# Patient Record
Sex: Female | Born: 1970 | Race: White | Hispanic: No | Smoking: Current every day smoker
Health system: Southern US, Community
[De-identification: ages and names within clinical notes are randomized; demographics above are authoritative.]

## PROBLEM LIST (undated history)

## (undated) DIAGNOSIS — E119 Type 2 diabetes mellitus without complications: Secondary | ICD-10-CM

## (undated) DIAGNOSIS — I82409 Acute embolism and thrombosis of unspecified deep veins of unspecified lower extremity: Secondary | ICD-10-CM

## (undated) DIAGNOSIS — M549 Dorsalgia, unspecified: Secondary | ICD-10-CM

## (undated) HISTORY — PX: VENTRICULOPERITONEAL SHUNT: SHX204

## (undated) HISTORY — PX: CHOLECYSTECTOMY: SHX55

---

## 1999-03-08 ENCOUNTER — Emergency Department (HOSPITAL_COMMUNITY): Admission: EM | Admit: 1999-03-08 | Discharge: 1999-03-08 | Payer: Self-pay | Admitting: Emergency Medicine

## 1999-03-17 ENCOUNTER — Emergency Department (HOSPITAL_COMMUNITY): Admission: EM | Admit: 1999-03-17 | Discharge: 1999-03-17 | Payer: Self-pay | Admitting: Emergency Medicine

## 1999-12-02 ENCOUNTER — Encounter: Admission: RE | Admit: 1999-12-02 | Discharge: 2000-01-02 | Payer: Self-pay | Admitting: Neurosurgery

## 2000-11-25 ENCOUNTER — Emergency Department (HOSPITAL_COMMUNITY): Admission: EM | Admit: 2000-11-25 | Discharge: 2000-11-25 | Payer: Self-pay | Admitting: Emergency Medicine

## 2000-11-25 ENCOUNTER — Encounter: Payer: Self-pay | Admitting: Emergency Medicine

## 2004-12-15 ENCOUNTER — Encounter: Admission: RE | Admit: 2004-12-15 | Discharge: 2004-12-15 | Payer: Self-pay | Admitting: *Deleted

## 2005-01-27 ENCOUNTER — Encounter (INDEPENDENT_AMBULATORY_CARE_PROVIDER_SITE_OTHER): Payer: Self-pay | Admitting: Specialist

## 2005-01-27 ENCOUNTER — Ambulatory Visit (HOSPITAL_COMMUNITY): Admission: RE | Admit: 2005-01-27 | Discharge: 2005-01-27 | Payer: Self-pay | Admitting: General Surgery

## 2007-06-28 ENCOUNTER — Other Ambulatory Visit: Admission: RE | Admit: 2007-06-28 | Discharge: 2007-06-28 | Payer: Self-pay | Admitting: Obstetrics and Gynecology

## 2008-03-23 ENCOUNTER — Encounter: Admission: RE | Admit: 2008-03-23 | Discharge: 2008-03-23 | Payer: Self-pay | Admitting: Neurology

## 2008-04-21 ENCOUNTER — Encounter: Admission: RE | Admit: 2008-04-21 | Discharge: 2008-04-21 | Payer: Self-pay | Admitting: Family Medicine

## 2008-04-24 ENCOUNTER — Ambulatory Visit: Payer: Self-pay | Admitting: Oncology

## 2008-07-17 ENCOUNTER — Encounter: Admission: RE | Admit: 2008-07-17 | Discharge: 2008-07-17 | Payer: Self-pay | Admitting: Gastroenterology

## 2008-07-20 ENCOUNTER — Encounter: Admission: RE | Admit: 2008-07-20 | Discharge: 2008-07-20 | Payer: Self-pay | Admitting: Gastroenterology

## 2008-07-31 ENCOUNTER — Other Ambulatory Visit: Admission: RE | Admit: 2008-07-31 | Discharge: 2008-07-31 | Payer: Self-pay | Admitting: Obstetrics and Gynecology

## 2008-08-03 ENCOUNTER — Ambulatory Visit: Payer: Self-pay | Admitting: Oncology

## 2008-08-31 LAB — COMPREHENSIVE METABOLIC PANEL
ALT: 23 U/L (ref 0–35)
AST: 20 U/L (ref 0–37)
BUN: 12 mg/dL (ref 6–23)
CO2: 24 mEq/L (ref 19–32)
Calcium: 9 mg/dL (ref 8.4–10.5)
Chloride: 110 mEq/L (ref 96–112)
Creatinine, Ser: 0.73 mg/dL (ref 0.40–1.20)
Total Bilirubin: 0.6 mg/dL (ref 0.3–1.2)

## 2008-08-31 LAB — CBC WITH DIFFERENTIAL/PLATELET
BASO%: 0.2 % (ref 0.0–2.0)
Basophils Absolute: 0 10*3/uL (ref 0.0–0.1)
EOS%: 1.2 % (ref 0.0–7.0)
HCT: 37.5 % (ref 34.8–46.6)
HGB: 12.5 g/dL (ref 11.6–15.9)
LYMPH%: 25.5 % (ref 14.0–49.7)
MCH: 29.6 pg (ref 25.1–34.0)
MCHC: 33.3 g/dL (ref 31.5–36.0)
NEUT%: 67 % (ref 38.4–76.8)
Platelets: 309 10*3/uL (ref 145–400)
lymph#: 2.5 10*3/uL (ref 0.9–3.3)

## 2008-08-31 LAB — LACTATE DEHYDROGENASE: LDH: 140 U/L (ref 94–250)

## 2008-09-05 LAB — D-DIMER, QUANTITATIVE: D-Dimer, Quant: 0.22 ug/mL-FEU (ref 0.00–0.48)

## 2008-09-05 LAB — FACTOR 8 ASSAY: Coagulation Factor VIII: 126 % (ref 73–140)

## 2008-09-08 LAB — HYPERCOAGULABLE PANEL, COMPREHENSIVE RET.

## 2008-10-07 ENCOUNTER — Ambulatory Visit: Payer: Self-pay | Admitting: Oncology

## 2008-11-26 ENCOUNTER — Ambulatory Visit: Payer: Self-pay | Admitting: Oncology

## 2008-12-30 ENCOUNTER — Ambulatory Visit: Payer: Self-pay | Admitting: Oncology

## 2009-03-24 ENCOUNTER — Ambulatory Visit: Payer: Self-pay | Admitting: Oncology

## 2009-04-15 ENCOUNTER — Ambulatory Visit: Payer: Self-pay | Admitting: Vascular Surgery

## 2009-04-15 ENCOUNTER — Encounter (HOSPITAL_COMMUNITY): Payer: Self-pay | Admitting: Oncology

## 2009-04-15 ENCOUNTER — Ambulatory Visit: Admission: RE | Admit: 2009-04-15 | Discharge: 2009-04-15 | Payer: Self-pay | Admitting: Oncology

## 2009-05-11 ENCOUNTER — Ambulatory Visit: Payer: Self-pay | Admitting: Oncology

## 2009-07-29 ENCOUNTER — Ambulatory Visit: Payer: Self-pay | Admitting: Oncology

## 2009-08-20 LAB — PROTEIN C, TOTAL: Protein C, Total: 117 % (ref 70–140)

## 2009-08-20 LAB — PROTEIN S ACTIVITY: Protein S Activity: 113 % (ref 69–129)

## 2009-08-20 LAB — PROTEIN C ACTIVITY: Protein C Activity: 162 % — ABNORMAL HIGH (ref 75–133)

## 2009-08-20 LAB — PROTEIN S, TOTAL: Protein S Ag, Total: 133 % (ref 70–140)

## 2009-08-20 LAB — PROTEIN S, ANTIGEN, FREE: Protein S Ag, Free: 114 % normal (ref 50–147)

## 2009-09-09 ENCOUNTER — Ambulatory Visit: Payer: Self-pay | Admitting: Oncology

## 2010-07-05 ENCOUNTER — Other Ambulatory Visit: Payer: Self-pay | Admitting: Family Medicine

## 2010-07-05 ENCOUNTER — Inpatient Hospital Stay: Admission: RE | Admit: 2010-07-05 | Payer: Self-pay | Source: Ambulatory Visit

## 2010-07-05 DIAGNOSIS — M79606 Pain in leg, unspecified: Secondary | ICD-10-CM

## 2010-07-05 DIAGNOSIS — R609 Edema, unspecified: Secondary | ICD-10-CM

## 2010-08-12 NOTE — Op Note (Signed)
Allison Vasquez, Allison Vasquez              ACCOUNT NO.:  [id number]   MEDICAL RECORD NO.:  [id number]          PATIENT TYPE:  OIB   LOCATION:  5731                         FACILITY:  [facility]   PHYSICIAN:  Ollen Gross. Vernell Morgans, M.D. DATE OF BIRTH:  1971-01-20   DATE OF PROCEDURE:  01/31/2005  DATE OF DISCHARGE:  01/27/2005                                 OPERATIVE REPORT   PREOPERATIVE DIAGNOSIS:  Gallstones.   POSTOP DIAGNOSIS:  Gallstones.   PROCEDURE:  Laparoscopic cholecystectomy with intraoperative cholangiogram.   SURGEON:  Dr. Carolynne Edouard.   ASSISTANT:  Dr. Derrell Lolling.   ANESTHESIA:  General endotracheal.   PROCEDURE:  After informed consent was obtained, the patient was brought to  the operating and placed in supine position on the operating table. After  adequate induction of general anesthesia, the patient's abdomen was prepped  with Betadine and draped in usual sterile manner. The area below the  umbilicus was infiltrated with 4% Marcaine. A small incision was made with a  15 blade knife. This incision was carried down through the skin through the  subcutaneous tissue bluntly with a hemostat and [employer] retractors until  the linea alba was identified. The linea alba was incised with a 7 blade  knife and each side was grasped Kocher clamps, elevated anteriorly. The  preperitoneal space was then probed bluntly with a hemostat until the  peritoneum was opened.  Access was gained to the abdominal cavity in the  fascia surrounding the opening.  A Hasson cannula was placed through the  opening and anchored in place with a previously placed Vicryl pursestring  stitch.  The abdomen was then insufflated with carbon dioxide without  difficulty. A laparoscope was inserted through the Hasson cannula and the  right upper quadrant was inspected. The dome of gallbladder and liver  readily identified. The patient had a VP shunt in place and VP shunt  appeared to be in good position and we were able to  easily avoid the shunt.  Next the epigastric region was infiltrated with 4% Marcaine. A small  incision was made with a 15 blade knife and 10 mm port was placed bluntly  through this incision into the abdominal cavity under direct vision. Sites  were then chosen lateral on the right side of the abdomen for placement of 5  mm ports. Each of these areas was infiltrated with 0.25% marcaine. 5 mm  ports were placed  through these incisions into the abdominal cavity under  direct vision. A blunt grasper was placed through the lateral most 5 mm port  and used to grasp the dome of gallbladder and elevate it anteriorly and  superiorly. Another blunt grasper was placed through the other 5 mm port and  used to retract on the body and neck of the gallbladder.  A dissector was  placed through the epigastric port and using electrocautery the peritoneal  reflection was opened. Blunt dissection was then carried out in this area  until the gallbladder neck cystic duct junction was readily identified and a  good window was created. The single clip was placed on the gallbladder  neck.  A small ductotomy was made just below the clip with the  laparoscopic  scissors. A 14 gauge Angiocath was then placed percutaneously through the  anterior abdominal wall under direct vision. A Reddick cholangiogram  catheter was then placed through the Angiocath and flushed. The Reddick  catheter was then placed within the cystic duct and anchored in place with  the clip and good length on the cystic duct.  The anchoring clip and  catheter were then removed from the patient. The three clips were placed  proximally on the duct and duct was divided between the two sets of clips.  Posterior to this, the cystic artery was identified and again dissected  bluntly in a circumferential manner until a good window was created. Two  clips placed proximal and one distal on the artery and the artery was  divided between the two. Next a  laparoscopic hook cautery device was used to  separate the gallbladder from the liver bed.  Prior to completely detaching  the gallbladder from the liver bed, the liver bed was inspected and several  small bleeding points were coagulated with electrocautery.  A small hole was  made in the gallbladder during this dissection and the bile was aspirated  out. Next the gallbladder was detached the rest of the way using the hook  electrocautery. A laparoscopic bag was inserted through the epigastric port.  The gallbladder was placed within the bag and bag was sealed.  The abdomen  was then irrigated with copious amounts of saline until the effluent was  clear. The laparoscope was then moved to the epigastric port and gallbladder  grasper was placed through the Hasson cannula and used to grasp the opening  of the bag. The bag with the gallbladder then removed through the  infraumbilical port without difficulty. The fascial defect was closed with  the previously placed Vicryl pursestring stitch as well as with another  interrupted 0 Vicryl stitch. The rest of ports were removed under direct  vision. Gas was allowed to escape. All ports were hemostatic. The skin  incisions were then closed with interrupted 4-0 Monocryl subcu stitches.  Benzoin, Steri-Strips were applied. The patient tolerated the procedure  well.  At the end of the case, all needle, sponge and instrument counts were  correct. The patient was then awakened and taken recovery in stable  condition.      Ollen Gross. Vernell Morgans, M.D.  Electronically Signed     PST/MEDQ  D:  01/31/2005  T:  02/01/2005  Job:  [id number]

## 2011-05-12 ENCOUNTER — Other Ambulatory Visit (HOSPITAL_COMMUNITY)
Admission: RE | Admit: 2011-05-12 | Discharge: 2011-05-12 | Disposition: A | Discharge status: Home / Self Care | Payer: PRIVATE HEALTH INSURANCE | Source: Ambulatory Visit | Attending: Obstetrics and Gynecology | Admitting: Obstetrics and Gynecology

## 2011-05-12 ENCOUNTER — Other Ambulatory Visit: Payer: Self-pay | Admitting: Obstetrics and Gynecology

## 2011-05-12 DIAGNOSIS — Z01419 Encounter for gynecological examination (general) (routine) without abnormal findings: Secondary | ICD-10-CM | POA: Insufficient documentation

## 2011-06-29 ENCOUNTER — Ambulatory Visit
Admission: RE | Admit: 2011-06-29 | Discharge: 2011-06-29 | Disposition: A | Discharge status: Home / Self Care | Payer: PRIVATE HEALTH INSURANCE | Source: Ambulatory Visit | Attending: Family Medicine | Admitting: Family Medicine

## 2011-06-29 ENCOUNTER — Other Ambulatory Visit: Payer: Self-pay | Admitting: Family Medicine

## 2011-06-29 DIAGNOSIS — R519 Headache, unspecified: Secondary | ICD-10-CM

## 2011-12-26 ENCOUNTER — Other Ambulatory Visit: Payer: Self-pay | Admitting: Obstetrics and Gynecology

## 2011-12-26 DIAGNOSIS — Z1231 Encounter for screening mammogram for malignant neoplasm of breast: Secondary | ICD-10-CM

## 2012-01-16 ENCOUNTER — Ambulatory Visit: Payer: PRIVATE HEALTH INSURANCE

## 2012-02-26 ENCOUNTER — Ambulatory Visit
Admission: RE | Admit: 2012-02-26 | Discharge: 2012-02-26 | Disposition: A | Discharge status: Home / Self Care | Payer: BC Managed Care – PPO | Source: Ambulatory Visit | Attending: Obstetrics and Gynecology | Admitting: Obstetrics and Gynecology

## 2012-02-26 DIAGNOSIS — Z1231 Encounter for screening mammogram for malignant neoplasm of breast: Secondary | ICD-10-CM

## 2012-10-23 ENCOUNTER — Ambulatory Visit
Admission: RE | Admit: 2012-10-23 | Discharge: 2012-10-23 | Disposition: A | Discharge status: Home / Self Care | Payer: BC Managed Care – PPO | Source: Ambulatory Visit | Attending: Family Medicine | Admitting: Family Medicine

## 2012-10-23 ENCOUNTER — Other Ambulatory Visit: Payer: Self-pay | Admitting: Family Medicine

## 2012-10-23 DIAGNOSIS — M545 Low back pain, unspecified: Secondary | ICD-10-CM

## 2012-10-24 ENCOUNTER — Other Ambulatory Visit: Payer: BC Managed Care – PPO

## 2013-04-23 ENCOUNTER — Other Ambulatory Visit: Payer: Self-pay

## 2013-04-23 DIAGNOSIS — Z1231 Encounter for screening mammogram for malignant neoplasm of breast: Secondary | ICD-10-CM

## 2013-04-30 ENCOUNTER — Ambulatory Visit
Admission: RE | Admit: 2013-04-30 | Discharge: 2013-04-30 | Disposition: A | Discharge status: Home / Self Care | Payer: BC Managed Care – PPO | Source: Ambulatory Visit

## 2013-04-30 DIAGNOSIS — Z1231 Encounter for screening mammogram for malignant neoplasm of breast: Secondary | ICD-10-CM

## 2013-05-01 ENCOUNTER — Other Ambulatory Visit (HOSPITAL_COMMUNITY)
Admission: RE | Admit: 2013-05-01 | Discharge: 2013-05-01 | Disposition: A | Discharge status: Home / Self Care | Payer: BC Managed Care – PPO | Source: Ambulatory Visit | Attending: Obstetrics and Gynecology | Admitting: Obstetrics and Gynecology

## 2013-05-01 ENCOUNTER — Other Ambulatory Visit: Payer: Self-pay | Admitting: Obstetrics and Gynecology

## 2013-05-01 DIAGNOSIS — Z1151 Encounter for screening for human papillomavirus (HPV): Secondary | ICD-10-CM | POA: Insufficient documentation

## 2013-05-01 DIAGNOSIS — Z01419 Encounter for gynecological examination (general) (routine) without abnormal findings: Secondary | ICD-10-CM | POA: Insufficient documentation

## 2013-05-08 ENCOUNTER — Ambulatory Visit: Payer: BC Managed Care – PPO

## 2013-07-17 ENCOUNTER — Ambulatory Visit
Admission: RE | Admit: 2013-07-17 | Discharge: 2013-07-17 | Disposition: A | Discharge status: Home / Self Care | Payer: BC Managed Care – PPO | Source: Ambulatory Visit | Attending: Neurosurgery | Admitting: Neurosurgery

## 2013-07-17 ENCOUNTER — Other Ambulatory Visit: Payer: Self-pay | Admitting: Neurosurgery

## 2013-07-17 DIAGNOSIS — Q057 Lumbar spina bifida without hydrocephalus: Secondary | ICD-10-CM

## 2013-07-17 DIAGNOSIS — M4120 Other idiopathic scoliosis, site unspecified: Secondary | ICD-10-CM

## 2013-09-12 DIAGNOSIS — M545 Low back pain, unspecified: Secondary | ICD-10-CM | POA: Insufficient documentation

## 2013-10-08 ENCOUNTER — Other Ambulatory Visit: Payer: Self-pay | Admitting: Gastroenterology

## 2013-10-08 DIAGNOSIS — R110 Nausea: Secondary | ICD-10-CM

## 2013-10-08 DIAGNOSIS — R1013 Epigastric pain: Secondary | ICD-10-CM

## 2013-10-09 ENCOUNTER — Ambulatory Visit
Admission: RE | Admit: 2013-10-09 | Discharge: 2013-10-09 | Disposition: A | Discharge status: Home / Self Care | Payer: BC Managed Care – PPO | Source: Ambulatory Visit | Attending: Gastroenterology | Admitting: Gastroenterology

## 2013-10-09 DIAGNOSIS — R110 Nausea: Secondary | ICD-10-CM

## 2013-10-09 DIAGNOSIS — R1013 Epigastric pain: Secondary | ICD-10-CM

## 2013-10-09 MED ORDER — IOHEXOL 300 MG/ML  SOLN
125.0000 mL | Freq: Once | INTRAMUSCULAR | Status: AC | PRN
  Administered 2013-10-09: 125 mL via INTRAVENOUS

## 2013-10-10 ENCOUNTER — Inpatient Hospital Stay: Admission: RE | Admit: 2013-10-10 | Payer: BC Managed Care – PPO | Source: Ambulatory Visit

## 2013-10-13 ENCOUNTER — Other Ambulatory Visit: Payer: BC Managed Care – PPO

## 2013-10-16 ENCOUNTER — Other Ambulatory Visit: Payer: Self-pay | Admitting: Gastroenterology

## 2013-10-30 ENCOUNTER — Other Ambulatory Visit: Payer: Self-pay | Admitting: Orthopaedic Surgery

## 2013-10-30 DIAGNOSIS — M542 Cervicalgia: Secondary | ICD-10-CM

## 2013-10-30 DIAGNOSIS — M545 Low back pain, unspecified: Secondary | ICD-10-CM

## 2013-11-21 ENCOUNTER — Ambulatory Visit
Admission: RE | Admit: 2013-11-21 | Discharge: 2013-11-21 | Disposition: A | Discharge status: Home / Self Care | Payer: BC Managed Care – PPO | Source: Ambulatory Visit | Attending: Orthopaedic Surgery | Admitting: Orthopaedic Surgery

## 2013-11-21 VITALS — BP 92/52 | HR 59

## 2013-11-21 DIAGNOSIS — M545 Low back pain, unspecified: Secondary | ICD-10-CM

## 2013-11-21 DIAGNOSIS — M542 Cervicalgia: Secondary | ICD-10-CM

## 2013-11-21 MED ORDER — ONDANSETRON HCL 4 MG/2ML IJ SOLN
4.0000 mg | Freq: Once | INTRAMUSCULAR | Status: AC
  Administered 2013-11-21: 4 mg via INTRAMUSCULAR

## 2013-11-21 MED ORDER — HYDROMORPHONE HCL PF 2 MG/ML IJ SOLN
2.0000 mg | Freq: Once | INTRAMUSCULAR | Status: AC
  Administered 2013-11-21: 2 mg via INTRAMUSCULAR

## 2013-11-21 MED ORDER — DIAZEPAM 5 MG PO TABS
10.0000 mg | ORAL_TABLET | Freq: Once | ORAL | Status: AC
Start: 2013-11-21 — End: 2013-11-21
  Administered 2013-11-21: 10 mg via ORAL

## 2013-11-21 MED ORDER — IOHEXOL 300 MG/ML  SOLN
10.0000 mL | Freq: Once | INTRAMUSCULAR | Status: AC | PRN
  Administered 2013-11-21: 10 mL via INTRATHECAL

## 2013-11-21 NOTE — Discharge Instructions (Signed)
Myelogram Discharge Instructions  1. Go home and rest quietly for the next 24 hours.  It is important to lie flat for the next 24 hours.  Get up only to go to the restroom.  You may lie in the bed or on a couch on your back, your stomach, your left side or your right side.  You may have one pillow under your head.  You may have pillows between your knees while you are on your side or under your knees while you are on your back.  2. DO NOT drive today.  Recline the seat as far back as it will go, while still wearing your seat belt, on the way home.  3. You may get up to go to the bathroom as needed.  You may sit up for 10 minutes to eat.  You may resume your normal diet and medications unless otherwise indicated.  Drink lots of extra fluids today and tomorrow.  4. The incidence of headache, nausea, or vomiting is about 5% (one in 20 patients).  If you develop a headache, lie flat and drink plenty of fluids until the headache goes away.  Caffeinated beverages may be helpful.  If you develop severe nausea and vomiting or a headache that does not go away with flat bed rest, call [phone].  5. You may resume normal activities after your 24 hours of bed rest is over; however, do not exert yourself strongly or do any heavy lifting tomorrow. If when you get up you have a headache when standing, go back to bed and force fluids for another 24 hours.  6. Call your physician for a follow-up appointment.  The results of your myelogram will be sent directly to your physician by the following day.  7. If you have any questions or if complications develop after you arrive home, please call [phone].  Discharge instructions have been explained to the patient.  The patient, or the person responsible for the patient, fully understands these instructions.      May resume Amitriptyline on Aug. 29, 2015, after 9:30 am.

## 2013-11-25 ENCOUNTER — Ambulatory Visit
Admission: RE | Admit: 2013-11-25 | Discharge: 2013-11-25 | Disposition: A | Discharge status: Home / Self Care | Payer: BC Managed Care – PPO | Source: Ambulatory Visit | Attending: Orthopaedic Surgery | Admitting: Orthopaedic Surgery

## 2013-11-25 ENCOUNTER — Telehealth: Payer: Self-pay | Admitting: Radiology

## 2013-11-25 ENCOUNTER — Other Ambulatory Visit: Payer: Self-pay | Admitting: Orthopaedic Surgery

## 2013-11-25 DIAGNOSIS — G971 Other reaction to spinal and lumbar puncture: Secondary | ICD-10-CM

## 2013-11-25 MED ORDER — IOHEXOL 180 MG/ML  SOLN
1.0000 mL | Freq: Once | INTRAMUSCULAR | Status: AC | PRN
  Administered 2013-11-25: 1 mL via INTRAVENOUS

## 2013-11-25 NOTE — Telephone Encounter (Signed)
Pt states she has headache since the weekend after having myelo on 11/21/13. Pt was still in bed when I returned her call. Pt called back after being up for about 20 minutes, with the return of her positional headache she would like to do the blood patch. I will call Dr. Noel Gerold re: order.

## 2013-11-25 NOTE — Discharge Instructions (Signed)

## 2013-11-25 NOTE — Progress Notes (Signed)
20cc blood drawn from left AC space for Blood Patch without difficulty; site unremarkable.  jkl

## 2014-05-04 ENCOUNTER — Ambulatory Visit: Payer: Self-pay | Admitting: Cardiology

## 2014-05-08 ENCOUNTER — Other Ambulatory Visit: Payer: Self-pay | Admitting: Obstetrics and Gynecology

## 2014-05-08 ENCOUNTER — Other Ambulatory Visit: Payer: Self-pay

## 2014-05-08 ENCOUNTER — Other Ambulatory Visit (HOSPITAL_COMMUNITY)
Admission: RE | Admit: 2014-05-08 | Discharge: 2014-05-08 | Disposition: A | Discharge status: Home / Self Care | Payer: BLUE CROSS/BLUE SHIELD | Source: Ambulatory Visit | Attending: Obstetrics and Gynecology | Admitting: Obstetrics and Gynecology

## 2014-05-08 DIAGNOSIS — Z01419 Encounter for gynecological examination (general) (routine) without abnormal findings: Secondary | ICD-10-CM | POA: Diagnosis not present

## 2014-05-08 DIAGNOSIS — Z1231 Encounter for screening mammogram for malignant neoplasm of breast: Secondary | ICD-10-CM

## 2014-05-11 LAB — CYTOLOGY - PAP

## 2014-05-13 ENCOUNTER — Ambulatory Visit
Admission: RE | Admit: 2014-05-13 | Discharge: 2014-05-13 | Disposition: A | Discharge status: Home / Self Care | Payer: BLUE CROSS/BLUE SHIELD | Source: Ambulatory Visit

## 2014-05-13 DIAGNOSIS — Z1231 Encounter for screening mammogram for malignant neoplasm of breast: Secondary | ICD-10-CM

## 2014-05-19 ENCOUNTER — Ambulatory Visit: Payer: Self-pay | Admitting: Cardiology

## 2014-06-17 ENCOUNTER — Emergency Department (HOSPITAL_BASED_OUTPATIENT_CLINIC_OR_DEPARTMENT_OTHER): Payer: BLUE CROSS/BLUE SHIELD

## 2014-06-17 ENCOUNTER — Emergency Department (HOSPITAL_BASED_OUTPATIENT_CLINIC_OR_DEPARTMENT_OTHER)
Admission: EM | Admit: 2014-06-17 | Discharge: 2014-06-18 | Disposition: A | Discharge status: Home / Self Care | Payer: BLUE CROSS/BLUE SHIELD | Attending: Emergency Medicine | Admitting: Emergency Medicine

## 2014-06-17 ENCOUNTER — Encounter (HOSPITAL_BASED_OUTPATIENT_CLINIC_OR_DEPARTMENT_OTHER): Payer: Self-pay | Admitting: Emergency Medicine

## 2014-06-17 DIAGNOSIS — Z720 Tobacco use: Secondary | ICD-10-CM | POA: Insufficient documentation

## 2014-06-17 DIAGNOSIS — R109 Unspecified abdominal pain: Secondary | ICD-10-CM | POA: Insufficient documentation

## 2014-06-17 DIAGNOSIS — Z79899 Other long term (current) drug therapy: Secondary | ICD-10-CM | POA: Insufficient documentation

## 2014-06-17 DIAGNOSIS — Z9049 Acquired absence of other specified parts of digestive tract: Secondary | ICD-10-CM | POA: Insufficient documentation

## 2014-06-17 DIAGNOSIS — M549 Dorsalgia, unspecified: Secondary | ICD-10-CM | POA: Diagnosis not present

## 2014-06-17 DIAGNOSIS — E119 Type 2 diabetes mellitus without complications: Secondary | ICD-10-CM | POA: Diagnosis not present

## 2014-06-17 HISTORY — DX: Dorsalgia, unspecified: M54.9

## 2014-06-17 HISTORY — DX: Type 2 diabetes mellitus without complications: E11.9

## 2014-06-17 LAB — COMPREHENSIVE METABOLIC PANEL
ALBUMIN: 3.5 g/dL (ref 3.5–5.2)
ALT: 36 U/L — ABNORMAL HIGH (ref 0–35)
AST: 21 U/L (ref 0–37)
Alkaline Phosphatase: 114 U/L (ref 39–117)
Anion gap: 9 (ref 5–15)
BUN: 17 mg/dL (ref 6–23)
CALCIUM: 9.2 mg/dL (ref 8.4–10.5)
CO2: 23 mmol/L (ref 19–32)
CREATININE: 0.6 mg/dL (ref 0.50–1.10)
Chloride: 107 mmol/L (ref 96–112)
GFR calc Af Amer: >90 mL/min (ref >90)
GFR calc non Af Amer: >90 mL/min (ref >90)
Glucose, Bld: 185 mg/dL — ABNORMAL HIGH (ref 70–99)
Potassium: 3.8 mmol/L (ref 3.5–5.1)
Sodium: 139 mmol/L (ref 135–145)
TOTAL PROTEIN: 7 g/dL (ref 6.0–8.3)
Total Bilirubin: 0.2 mg/dL — ABNORMAL LOW (ref 0.3–1.2)

## 2014-06-17 LAB — CBC WITH DIFFERENTIAL/PLATELET
BASOS ABS: 0 10*3/uL (ref 0.0–0.1)
BASOS PCT: 0 % (ref 0–1)
EOS ABS: 0.5 10*3/uL (ref 0.0–0.7)
EOS PCT: 5 % (ref 0–5)
HCT: 40.8 % (ref 36.0–46.0)
Hemoglobin: 13.2 g/dL (ref 12.0–15.0)
LYMPHS PCT: 29 % (ref 12–46)
Lymphs Abs: 3 10*3/uL (ref 0.7–4.0)
MCH: 28.7 pg (ref 26.0–34.0)
MCHC: 32.4 g/dL (ref 30.0–36.0)
MCV: 88.7 fL (ref 78.0–100.0)
Monocytes Absolute: 0.7 10*3/uL (ref 0.1–1.0)
Monocytes Relative: 7 % (ref 3–12)
Neutro Abs: 5.9 10*3/uL (ref 1.7–7.7)
Neutrophils Relative %: 59 % (ref 43–77)
PLATELETS: 258 10*3/uL (ref 150–400)
RBC: 4.6 MIL/uL (ref 3.87–5.11)
RDW: 14.7 % (ref 11.5–15.5)
WBC: 10.1 10*3/uL (ref 4.0–10.5)

## 2014-06-17 LAB — URINALYSIS, ROUTINE W REFLEX MICROSCOPIC
Bilirubin Urine: NEGATIVE
GLUCOSE, UA: 500 mg/dL — AB
Hgb urine dipstick: NEGATIVE
Ketones, ur: NEGATIVE mg/dL
Leukocytes, UA: NEGATIVE
NITRITE: NEGATIVE
PH: 5.5 (ref 5.0–8.0)
Protein, ur: NEGATIVE mg/dL
SPECIFIC GRAVITY, URINE: 1.034 — AB (ref 1.005–1.030)
Urobilinogen, UA: 0.2 mg/dL (ref 0.0–1.0)

## 2014-06-17 LAB — CBG MONITORING, ED: GLUCOSE-CAPILLARY: 179 mg/dL — AB (ref 70–99)

## 2014-06-17 LAB — LIPASE, BLOOD: LIPASE: 28 U/L (ref 11–59)

## 2014-06-17 MED ORDER — HYDROMORPHONE HCL 1 MG/ML IJ SOLN
1.0000 mg | Freq: Once | INTRAMUSCULAR | Status: AC
  Administered 2014-06-17: 1 mg via INTRAVENOUS
  Filled 2014-06-17: qty 1

## 2014-06-17 MED ORDER — HYDROMORPHONE HCL 1 MG/ML IJ SOLN
1.0000 mg | Freq: Once | INTRAMUSCULAR | Status: AC
Start: 2014-06-17 — End: 2014-06-17
  Administered 2014-06-17: 1 mg via INTRAVENOUS
  Filled 2014-06-17: qty 1

## 2014-06-17 NOTE — ED Notes (Signed)
Pt reports right flank pain, non radiating, denies other s/s, reports chronic history of back problems

## 2014-06-17 NOTE — ED Provider Notes (Signed)
CSN: [id number]     Arrival date & time 06/17/14  1940 History  This chart was scribed for Allison Bucco, MD by Freida Busman, ED Scribe. This patient was seen in room MH08/MH08 and the patient's care was started 8:55 PM.    Chief Complaint  Patient presents with  . Abdominal Pain    The history is provided by the patient. No language interpreter was used.     HPI Comments:  Allison Vasquez is a 44 y.o. female with a h/o cholecysytectomy and kidney stones who presents to the Emergency Department complaining of intermittent sharp stabbing right sided abdominal pain that started 2-3 days ago. She states today's episode started ~1500 today and has been constant since. Pt reports a h/o similar pain~ 3 times before . She has been evaluated for the pain by GI but never received a diagnosis. She notes pain with those episodes eventually resolved on its own. Her last episode was ~ 3-4 months ago. Pt notes pain today is dissimilar to pain felt with past kidney stones. She denies nausea, vomiting, fever, hematuria, dysuria, constipation, and blood in her stool.  Pt has been oxymorphone for her  chronic  back pain without relief of her abdominal pain.  Past Medical History  Diagnosis Date  . Back pain   . Diabetes mellitus without complication    Past Surgical History  Procedure Laterality Date  . Ventriculoperitoneal shunt    . Cholecystectomy     History reviewed. No pertinent family history. History  Substance Use Topics  . Smoking status: Current Every Day Smoker -- 1.00 packs/day    Types: Cigarettes  . Smokeless tobacco: Not on file  . Alcohol Use: No   OB History    No data available     Review of Systems  Constitutional: Negative for fever, chills, diaphoresis and fatigue.  HENT: Negative for congestion, rhinorrhea and sneezing.   Eyes: Negative.   Respiratory: Negative for cough, chest tightness and shortness of breath.   Cardiovascular: Negative for chest pain and leg  swelling.  Gastrointestinal: Positive for abdominal pain. Negative for nausea, vomiting, diarrhea, constipation and blood in stool.  Genitourinary: Negative for dysuria, frequency, hematuria, flank pain and difficulty urinating.  Musculoskeletal: Positive for back pain. Negative for arthralgias.  Skin: Negative for rash.  Neurological: Negative for dizziness, speech difficulty, weakness, numbness and headaches.  All other systems reviewed and are negative.     Allergies  Vioxx; Gabapentin; and Lyrica  Home Medications   Prior to Admission medications   Medication Sig Start Date End Date Taking? Authorizing Provider  Canagliflozin-Metformin HCl 150-500 MG TABS Take by mouth.   Yes Historical Provider, MD  glipiZIDE (GLUCOTROL XL) 2.5 MG 24 hr tablet Take 2.5 mg by mouth daily with breakfast.   Yes Historical Provider, MD  omeprazole-sodium bicarbonate (ZEGERID) 40-1100 MG per capsule Take 1 capsule by mouth daily before breakfast.   Yes Historical Provider, MD  oxymorphone (OPANA) 10 MG tablet Take 10 mg by mouth every 4 (four) hours as needed for pain.   Yes Historical Provider, MD  simvastatin (ZOCOR) 20 MG tablet Take 20 mg by mouth daily.   Yes Historical Provider, MD   BP 102/57 mmHg  Pulse 70  Temp(Src) 98 F (36.7 C) (Oral)  Resp 16  Ht  (1.702 m)  Wt 270 lb (122.471 kg)  BMI 42.28 kg/m2  SpO2 98%  LMP 05/19/2014 Physical Exam  Constitutional: She is oriented to person, place, and time. She  appears well-developed and well-nourished.  HENT:  Head: Normocephalic and atraumatic.  Eyes: Pupils are equal, round, and reactive to light.  Neck: Normal range of motion. Neck supple.  Cardiovascular: Normal rate, regular rhythm and normal heart sounds.   Pulmonary/Chest: Effort normal and breath sounds normal. No respiratory distress. She has no wheezes. She has no rales. She exhibits no tenderness.  Abdominal: Soft. Bowel sounds are normal. There is tenderness (+tenderness  to right mid abdomen). There is no rebound and no guarding.  Musculoskeletal: Normal range of motion. She exhibits no edema.  Lymphadenopathy:    She has no cervical adenopathy.  Neurological: She is alert and oriented to person, place, and time.  Skin: Skin is warm and dry. No rash noted.  Psychiatric: She has a normal mood and affect.    ED Course  Procedures (including critical care time)  DIAGNOSTIC STUDIES:  Oxygen Saturation is 98% on RA, normal by my interpretation.    COORDINATION OF CARE:  8:58 PM Pt updated with partial results. Will order pain meds. Discussed treatment plan with pt at bedside and pt agreed to plan.  Labs Review Labs Reviewed  URINALYSIS, ROUTINE W REFLEX MICROSCOPIC - Abnormal; Notable for the following:    Specific Gravity, Urine 1.034 (*)    Glucose, UA 500 (*)    All other components within normal limits  COMPREHENSIVE METABOLIC PANEL - Abnormal; Notable for the following:    Glucose, Bld 185 (*)    ALT 36 (*)    Total Bilirubin 0.2 (*)    All other components within normal limits  CBG MONITORING, ED - Abnormal; Notable for the following:    Glucose-Capillary 179 (*)    All other components within normal limits  CBC WITH DIFFERENTIAL/PLATELET  LIPASE, BLOOD    Imaging Review Ct Renal Stone Study  06/18/2014   CLINICAL DATA:  Acute onset of right-sided abdominal pain for 2-3 days. Initial encounter.  EXAM: CT ABDOMEN AND PELVIS WITHOUT CONTRAST  TECHNIQUE: Multidetector CT imaging of the abdomen and pelvis was performed following the standard protocol without IV contrast.  COMPARISON:  CT of the abdomen and pelvis performed 10/09/2013  FINDINGS: Minimal right basilar atelectasis is noted.  The liver and spleen are unremarkable in appearance. The patient is status post cholecystectomy, with clips noted at the gallbladder fossa. The pancreas and adrenal glands are unremarkable.  The kidneys are unremarkable in appearance. There is no evidence of  hydronephrosis. No renal or ureteral stones are seen. No perinephric stranding is appreciated.  No free fluid is identified. The small bowel is unremarkable in appearance. The stomach is within normal limits. No acute vascular abnormalities are seen.  The appendix is normal in caliber, without evidence of appendicitis. The colon is largely decompressed and is unremarkable in appearance.  The patient's ventriculoperitoneal shunt is noted ending at the lower pelvis.  The bladder is mildly distended and grossly unremarkable. The intrauterine device is somewhat distal in position, along the lower uterine segment, with the prongs noted extending into the myometrium. The uterus is otherwise grossly unremarkable. The ovaries are relatively symmetric. No suspicious adnexal masses are seen. No inguinal lymphadenopathy is seen.  No acute osseous abnormalities are identified. There appears to be a chronic right-sided pars defect at L3, without evidence of anterolisthesis.  IMPRESSION: 1. No acute abnormality seen to explain the patient's symptoms. 2. Intrauterine device is somewhat distal in position, seen along the lower uterine segment, with the prongs noted extending into the myometrium. OB/GYN consultation  is suggested for repositioning. 3. Visualized portions of the ventriculoperitoneal shunt are unremarkable. 4. 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   Electronically Signed   By: Roanna Raider M.D.   On: 06/18/2014 01:51     EKG Interpretation None      MDM   Final diagnoses:  Abdominal pain, unspecified abdominal location    Pt with right mid abd pain.  CT pending.  Dr. Nicanor Alcon to follow up on this and dispo.   I personally performed the services described in this documentation, which was scribed in my presence.  The recorded information has been reviewed and considered.      Allison Bucco, MD 06/19/14 [phone]

## 2014-06-17 NOTE — ED Notes (Signed)
MD at bedside. 

## 2014-06-18 ENCOUNTER — Ambulatory Visit: Payer: Self-pay | Admitting: Cardiology

## 2014-06-18 NOTE — ED Notes (Signed)
MD at bedside discussing test results and dispo plan of care. 

## 2014-07-02 ENCOUNTER — Other Ambulatory Visit: Payer: Self-pay | Admitting: Gastroenterology

## 2014-07-02 DIAGNOSIS — R109 Unspecified abdominal pain: Secondary | ICD-10-CM

## 2014-07-14 ENCOUNTER — Inpatient Hospital Stay
Admission: RE | Admit: 2014-07-14 | Discharge: 2014-07-14 | Disposition: A | Discharge status: Home / Self Care | Payer: BLUE CROSS/BLUE SHIELD | Source: Ambulatory Visit | Attending: Gastroenterology | Admitting: Gastroenterology

## 2014-07-14 ENCOUNTER — Ambulatory Visit
Admission: RE | Admit: 2014-07-14 | Discharge: 2014-07-14 | Disposition: A | Discharge status: Home / Self Care | Payer: BLUE CROSS/BLUE SHIELD | Source: Ambulatory Visit | Attending: Gastroenterology | Admitting: Gastroenterology

## 2014-07-14 DIAGNOSIS — R109 Unspecified abdominal pain: Secondary | ICD-10-CM

## 2014-07-23 ENCOUNTER — Other Ambulatory Visit: Payer: BLUE CROSS/BLUE SHIELD

## 2014-07-30 ENCOUNTER — Other Ambulatory Visit: Payer: Self-pay | Admitting: Pain Medicine

## 2014-07-30 DIAGNOSIS — G919 Hydrocephalus, unspecified: Secondary | ICD-10-CM

## 2014-08-04 ENCOUNTER — Ambulatory Visit
Admission: RE | Admit: 2014-08-04 | Discharge: 2014-08-04 | Disposition: A | Discharge status: Home / Self Care | Payer: BLUE CROSS/BLUE SHIELD | Source: Ambulatory Visit | Attending: Pain Medicine | Admitting: Pain Medicine

## 2014-08-04 DIAGNOSIS — G919 Hydrocephalus, unspecified: Secondary | ICD-10-CM

## 2014-10-23 IMAGING — RF DG MYELOGRAPHY LUMBAR INJ MULTI REGION
10 of 24 series · 10 of 24 positions shown · non-contrast
Comparison: none

CLINICAL DATA: Cervical spine pain and lumbar spine pain. [DATE]
pain when seen in consultation.
TECHNIQUE: Contiguous axial images were obtained through the Cervical,
Thoracic, and Lumbar spine after the intrathecal infusion of
infusion. Coronal and sagittal reconstructions were obtained of the
axial image sets.

[Series 3: myelogram  white · 1 of 1 slices shown (1 of 10)]
[im 1/1]
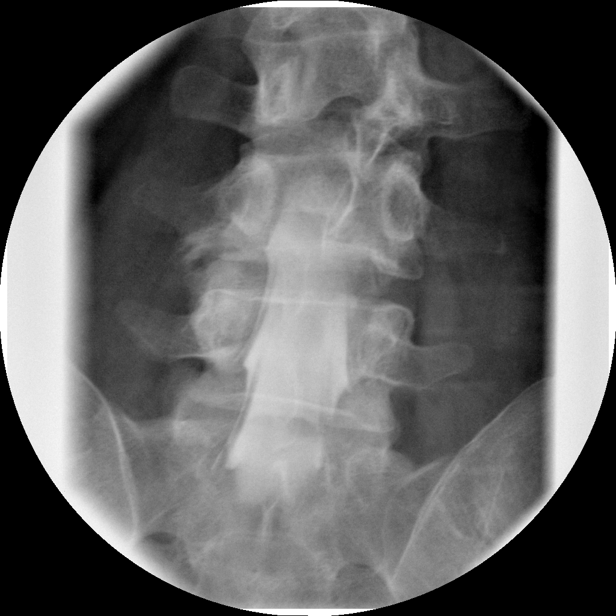

[Series 5: myelogram  white · 1 of 1 slices shown (2 of 10)]
[im 1/1]
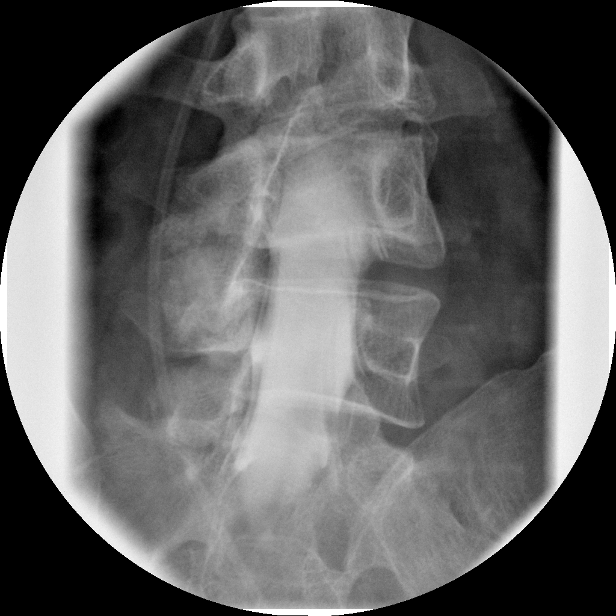

[Series 8: myelogram  white · 1 of 1 slices shown (3 of 10)]
[im 1/1]
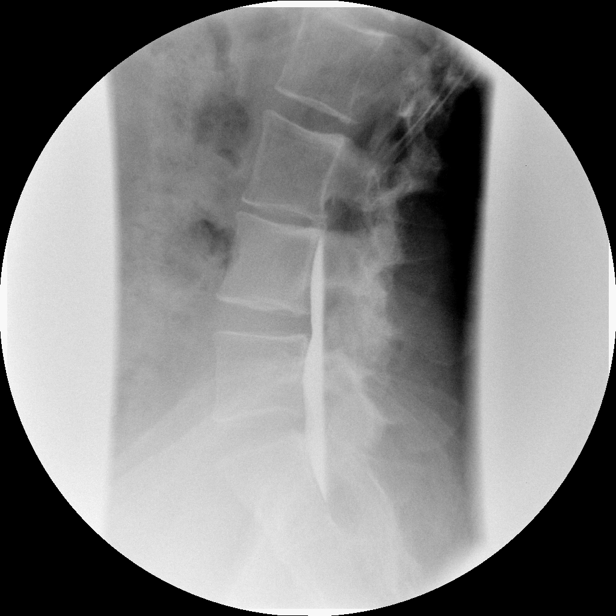

[Series 10: myelogram  white · 1 of 1 slices shown (4 of 10)]
[im 1/1]
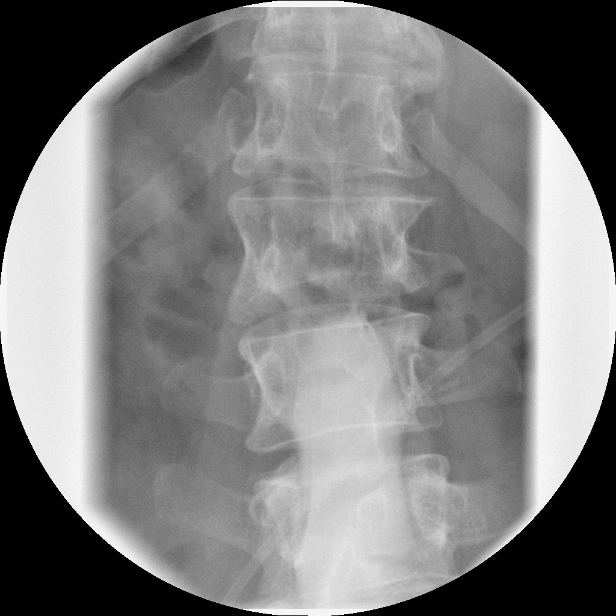

[Series 12: myelogram  white · 1 of 1 slices shown (5 of 10)]
[im 1/1]
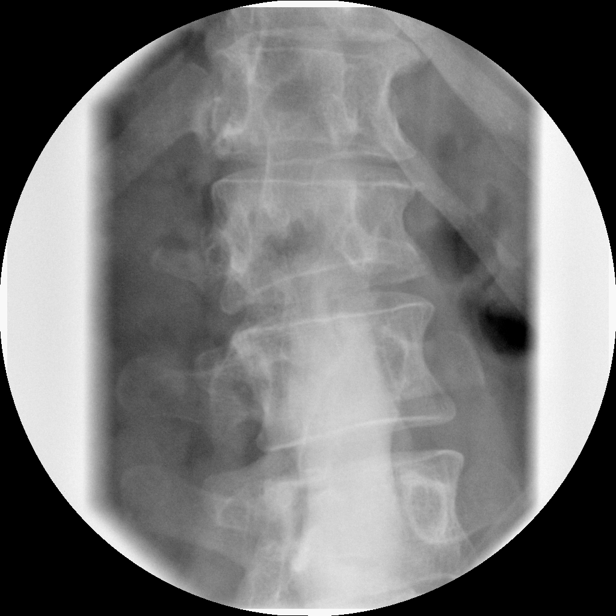

[Series 15: myelogram  white · 1 of 1 slices shown (6 of 10)]
[im 1/1]
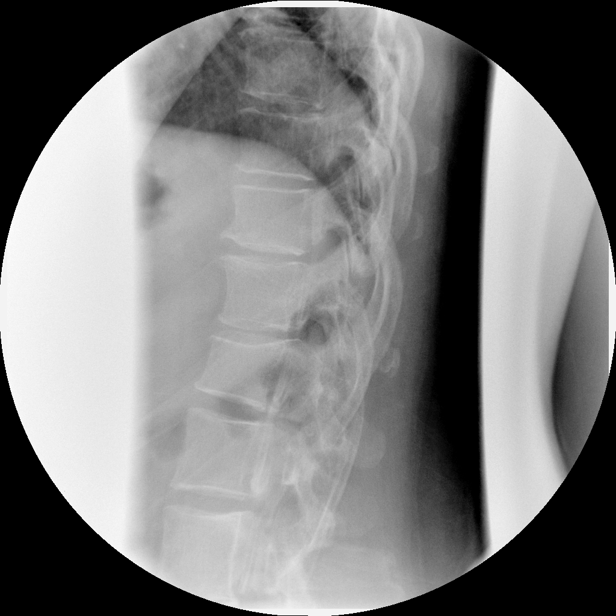

[Series 17: myelogram  white · 1 of 1 slices shown (7 of 10)]
[im 1/1]
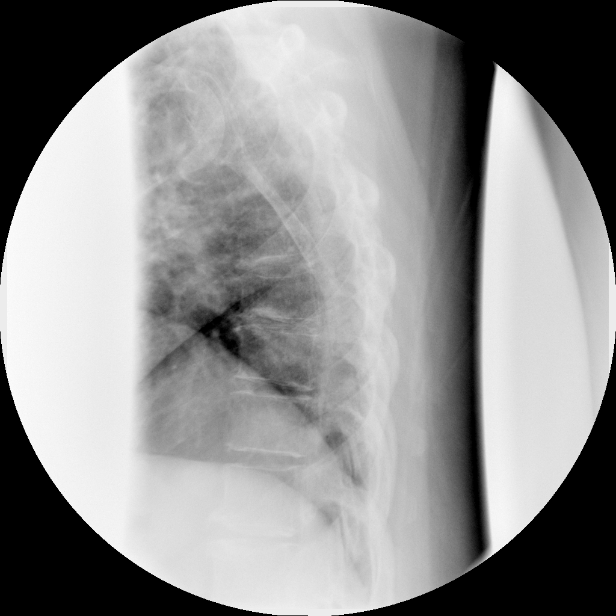

[Series 20: myelogram  white · 1 of 1 slices shown (8 of 10)]
[im 1/1]
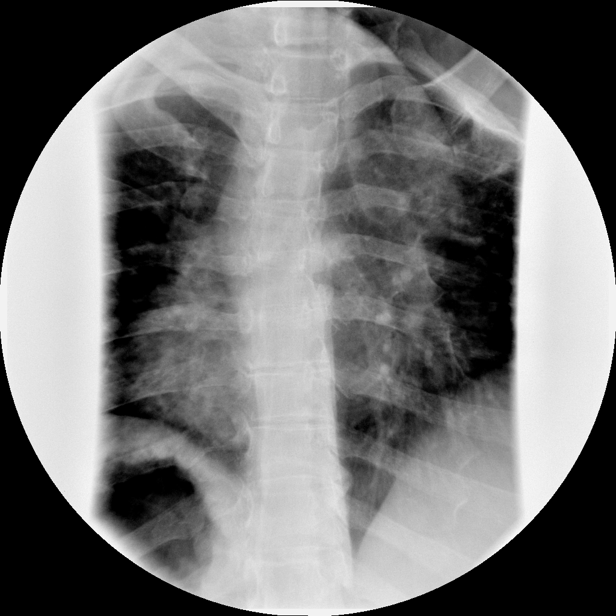

[Series 22: myelogram  white · 1 of 1 slices shown (9 of 10)]
[im 1/1]
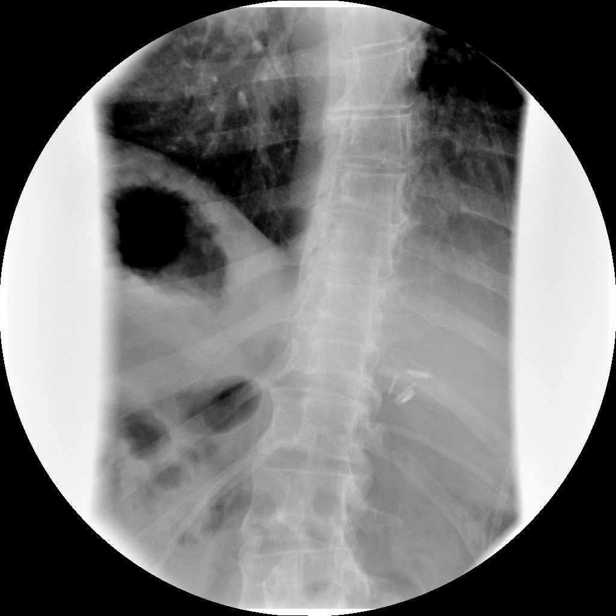

[Series 24: myelogram  white · 1 of 1 slices shown (10 of 10)]
[im 1/1]
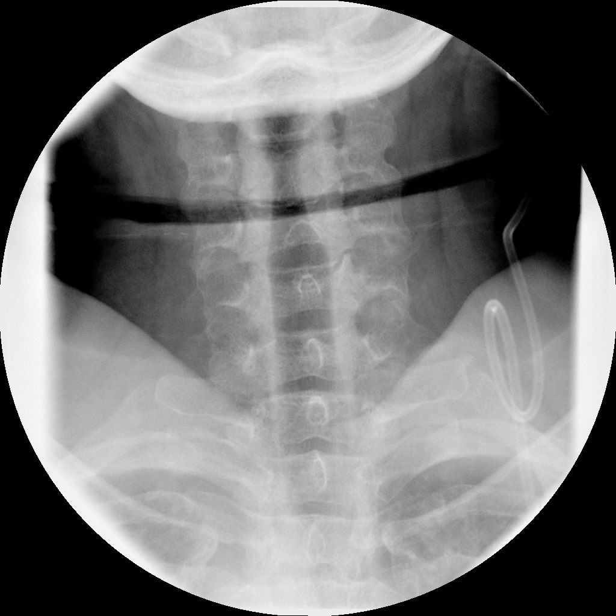

[10 of 24 positions shown; findings below may reference images not displayed]

FLUOROSCOPY TIME:  2 min 32 seconds

PROCEDURE:
LUMBAR PUNCTURE FOR CERVICAL LUMBAR AND THORACIC MYELOGRAM

CERVICAL AND LUMBAR AND THORACIC MYELOGRAM

CT CERVICAL MYELOGRAM

CT LUMBAR MYELOGRAM

CT THORACIC MYELOGRAM

After thorough discussion of risks and benefits of the procedure
including bleeding, infection, injury to nerves, blood vessels,
adjacent structures as well as headache and CSF leak, written and
oral informed consent was obtained. Consent was obtained by Dr.
Danyelle Forlife.

Patient was positioned prone on the fluoroscopy table. Local
anesthesia was provided with 1% lidocaine without epinephrine after
prepped and draped in the usual sterile fashion. Puncture was
performed at L4-L5 using a 5 inch 22-gauge spinal needle via RIGHT
paramedian approach. Using a single pass through the dura, the
needle was placed within the thecal sac, with return of clear CSF.
10 mL Rmnipaque-[id number] was injected into the thecal sac, with normal
opacification of the nerve roots and cauda equina consistent with
free flow within the subarachnoid space. The patient was then moved
to the trendelenburg position and contrast flowed into the Thoracic
and Cervical spine regions.

I personally performed the lumbar puncture and administered the
intrathecal contrast. I also personally supervised acquisition of
the myelogram images.
FINDINGS: CERVICAL AND LUMBAR MYELOGRAM FINDINGS:

Standing upright images of the lumbar spine demonstrate a levoconvex
curve with the apex at T12-L1. Cholecystectomy clips are present in
the right upper quadrant. Mild wedging of the L1 vertebra is
present, which may be developmental. This appears to be a congenital
anomaly with decreased height on the RIGHT in the spectrum of
hemivertebra. Rudimentary L3-L4 disc. Grade I anterolisthesis of L4
on L5 is present which appears degenerative and facet mediated. No
pathologic motion with flexion and extension. Anterolisthesis of L4
on L5 measures 2 mm in all positions. Cervical spine myelogram
images are within normal limits. No stenosis is identified.

CT CERVICAL MYELOGRAM FINDINGS:

Alignment: Anatomic.

Vertebrae: No destructive osseous lesions. Normal vertebral body
height.

Cord: Normal. Visualization of the inferior cervical spine is
degraded by body habitus.

Posterior Fossa: Grossly normal.

Vertebral Arteries: Grossly within normal limits.

Paraspinal tissues: Normal.  Lung apices are within normal limits.

Disc levels:

Incidental note is made of failure of fusion of the posterior arch
of C1. The odontoid appears within normal limits.

C2-C3:  Negative.

C3-C4:  Negative.

C4-C5:  Negative.

C5-C6: There is a RIGHT paracentral and posterior lateral calcified
disc protrusion with endplate osteophytes. This produces mild
central stenosis, contacting the RIGHT ventral aspect of the
cervical cord with flattening. Mild RIGHT foraminal stenosis due to
uncovertebral spurring which blends with disc osteophyte complex.
LEFT neural foramen appears patent.

C6-C7:  Negative.

C7-T1:  Negative.

CT THORACIC MYELOGRAM FINDINGS:

Alignment: There is a mild dextroconvex curve with the apex around
T8. Mildly exaggerated thoracic kyphosis associated with mid to
lower thoracic compression fractures.

Vertebrae: Mild chronic appearing compression deformities of T7 and
T8 with about 20% loss of anterior vertebral body height. No
destructive osseous lesions are identified.

Cord: No swelling to suggest an intramedullary lesion.

Paraspinal tissues: Within normal limits.

Disc levels:

Mild degenerative disc disease is present throughout the thoracic
spine.

T1-T2: Tiny central disc protrusion without resulting stenosis.

T2-T3:  Tiny LEFT paracentral disc protrusion without stenosis.

T3-T4:  Negative.

T4-T5:  Negative.

T5-T6:  Negative.

T6-T7:  Negative.

T7-T8:  Negative.

T8-T9:  Negative.

T9-T10: Small central disc protrusion narrowing the ventral
subarachnoid space and just contacting the ventral cord.

T10-T11:  Negative.

T11-T12:  Negative.

T12-L1: RIGHT facet arthrosis. Mild RIGHT foraminal stenosis
potentially affecting the RIGHT T12 nerve. This is secondary to
facet arthrosis and spurring from the RIGHT inferior T12 endplate.

CT LUMBAR MYELOGRAM FINDINGS:

Segmentation: Anatomic segmentation.

Alignment: 2 mm anterolisthesis of L4 on L5 appears similar to
standing radiographs. Dextroconvex curvature is present with the
apex at L3.

Vertebrae: As noted above, congenital/ developmental deformity of
the L1 and L3-L4 levels is present. L1 shows hypoplastic RIGHT side
of the vertebral body accounting for the scoliosis. L3 and L4
vertebrae air dysplastic with a rudimentary disc. No destructive
osseous lesions. Bilateral SI joint degenerative disease.

Conus medullaris: Terminates at L2.

Paraspinal tissues: Atherosclerosis.

Disc levels:

L1-L2: Dysplastic RIGHT facet joint, with rudimentary L1 inferior
articular process. Ossicles are present both anteriorly and
posteriorly around this dysplastic facet joint. Moderate RIGHT
foraminal stenosis associated with dysplastic facet joint and
ossicles. The central canal, lateral recesses and LEFT neural
foramen appear patent.

L2-L3: Defect in the inferior RIGHT lamina of L2, also congenital
and dysplastic. Dysplastic RIGHT facet joint is present with a
rudimentary inferior articular process of L2 articulating with the
anterior superior articular process of L3 rather than the other way
around. No resulting stenosis.

L3-L4: Both vertebrae are dysplastic. Failure fusion of the
posterior elements of L3. The disc is rudimentary and mildly
degenerated eccentric to the RIGHT. The failure of fusion has
resulted in duplicated spinous processes, 1 more inferior than the
other. Dysmorphic hypoplastic RIGHT facet joint. Mild LEFT foraminal
stenosis associated with degenerative disc disease and degenerated
dysplastic RIGHT facet joint.

L4-L5: Again, the vertebra is dysplastic. Central canal and lateral
recesses appear patent. The LEFT facet joint is dysplastic with an
obliquely oriented pars defect. Central canal and lateral recesses
appear adequately patent. Very mild LEFT foraminal stenosis
associated with hypertrophic changes of the LEFT facet joint.

L5-S1:  LEFT facet arthrosis.  No stenosis.
IMPRESSION: 1. Technically successful lumbar puncture for total myelogram.
2. Mild cervical spondylosis with RIGHT paracentral and posterior
lateral calcified disc protrusion at C5-C6 producing mild central
stenosis. Mild RIGHT foraminal stenosis potentially affecting the
RIGHT C6 nerve.
3. Mild thoracic spondylosis.
4. Anatomic segmentation with congenital deformities of lumbar
vertebrae, detailed above.
5. L4-L5 mild LEFT foraminal stenosis associated with facet
arthrosis. Grade I anterolisthesis appears degenerative and facet
mediated, with contributions from dysplastic facet joints.
6. Mild RIGHT L3-L4 foraminal stenosis associated with dysplastic
facet joint.

## 2015-05-11 ENCOUNTER — Other Ambulatory Visit: Payer: Self-pay

## 2015-05-11 DIAGNOSIS — Z1231 Encounter for screening mammogram for malignant neoplasm of breast: Secondary | ICD-10-CM

## 2015-05-14 ENCOUNTER — Other Ambulatory Visit (HOSPITAL_COMMUNITY)
Admission: RE | Admit: 2015-05-14 | Discharge: 2015-05-14 | Disposition: A | Discharge status: Home / Self Care | Payer: 59 | Source: Ambulatory Visit | Attending: Obstetrics and Gynecology | Admitting: Obstetrics and Gynecology

## 2015-05-14 ENCOUNTER — Other Ambulatory Visit: Payer: Self-pay | Admitting: Obstetrics and Gynecology

## 2015-05-14 DIAGNOSIS — Z01419 Encounter for gynecological examination (general) (routine) without abnormal findings: Secondary | ICD-10-CM | POA: Diagnosis not present

## 2015-05-18 LAB — CYTOLOGY - PAP

## 2015-05-20 IMAGING — CT CT RENAL STONE PROTOCOL
2 of 4 series · 16 of 46 positions shown, 18 images · non-contrast
Comparison: CT of the abdomen and pelvis performed 10/09/2013

CLINICAL DATA: Acute onset of right-sided abdominal pain for 2-3
days. Initial encounter.

EXAM:
CT ABDOMEN AND PELVIS WITHOUT CONTRAST
TECHNIQUE: Multidetector CT imaging of the abdomen and pelvis was performed
following the standard protocol without IV contrast.

[Series 2: renal stone > 200 lbs 5.0 b31f · axial · 0.96mm/px · z∈[-429,-4]mm · 13 of 95 slices shown, 15 images]
[im 5/95  soft-tissue]
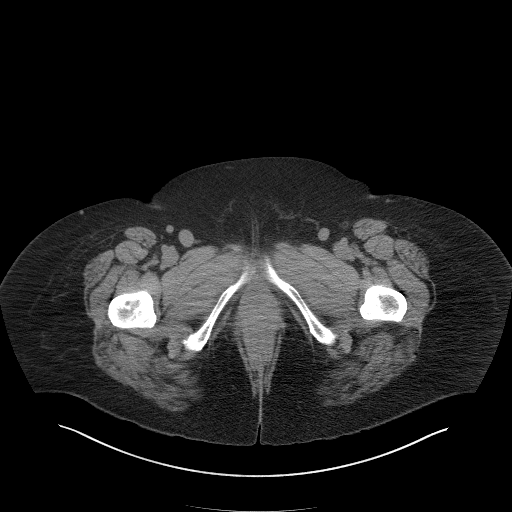
[im 5/95  bone]
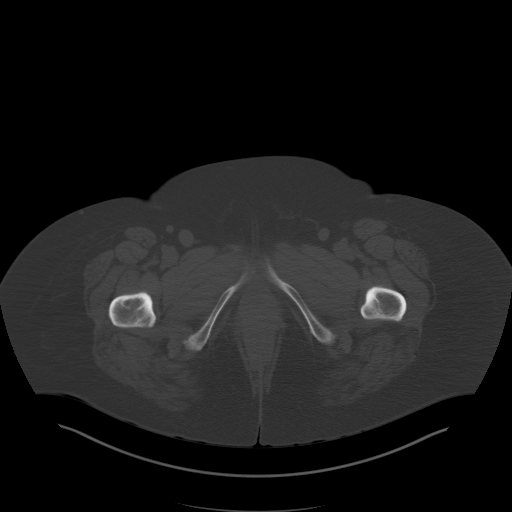
[im 13/95  soft-tissue]
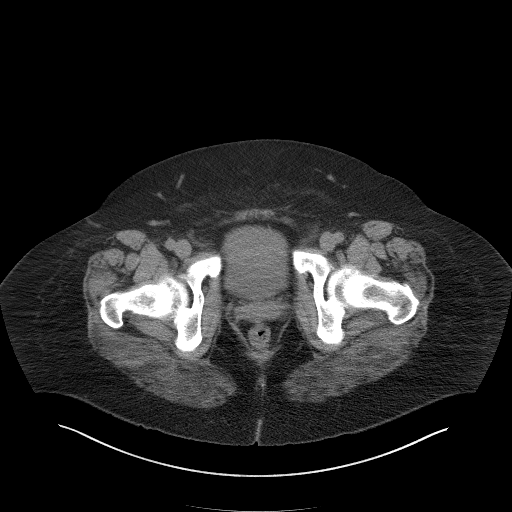
[im 22/95  soft-tissue]
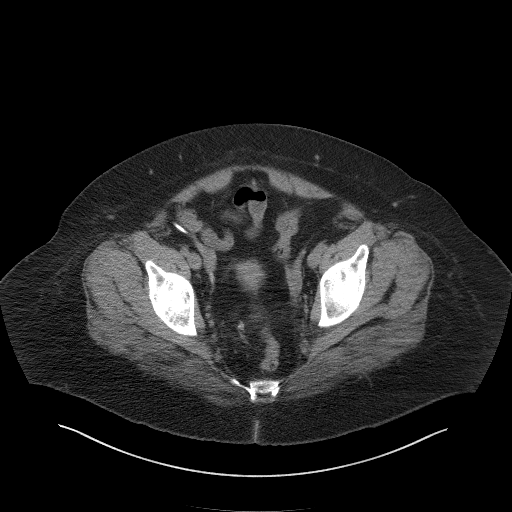
[im 26/95  soft-tissue]
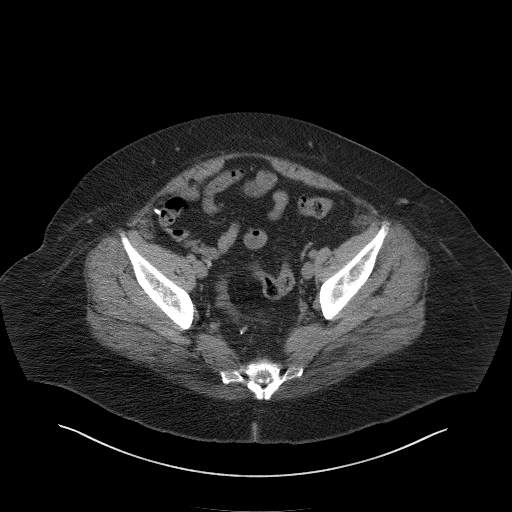
[im 35/95  soft-tissue]
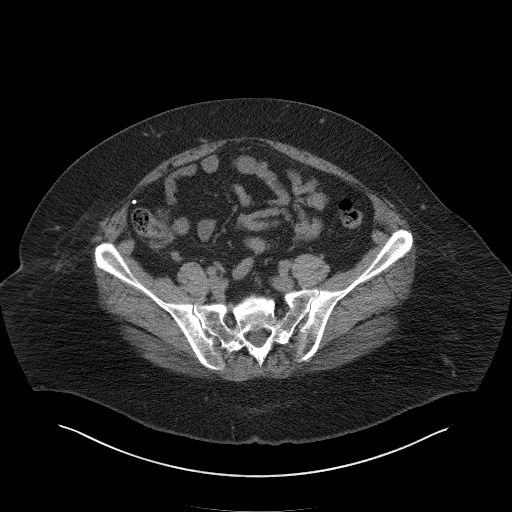
[im 39/95  soft-tissue]
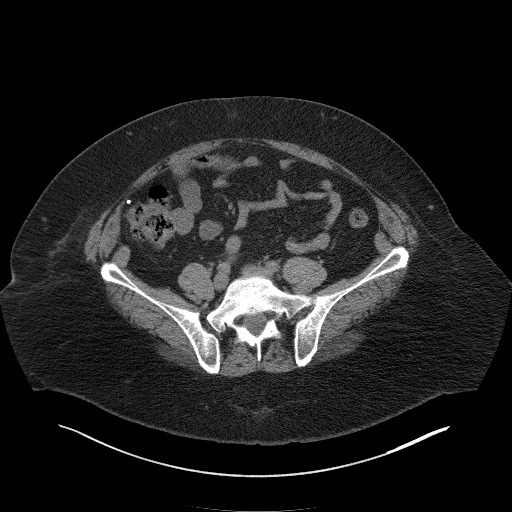
[im 48/95  soft-tissue]
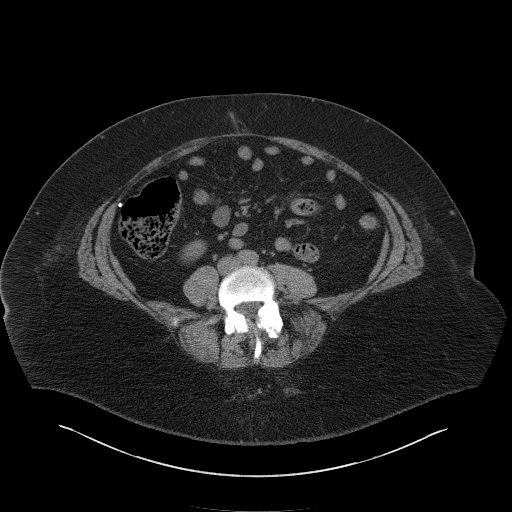
[im 56/95  soft-tissue]
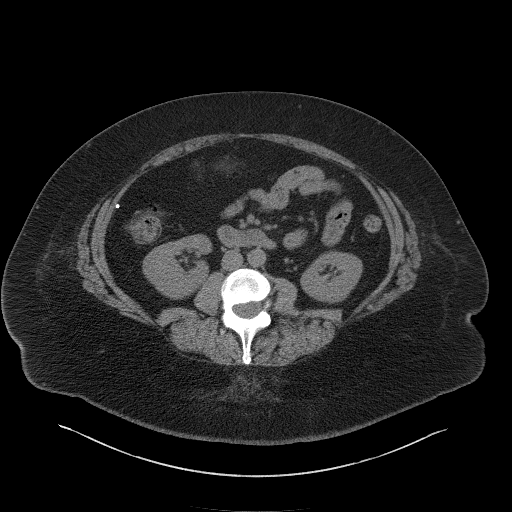
[im 60/95  soft-tissue]
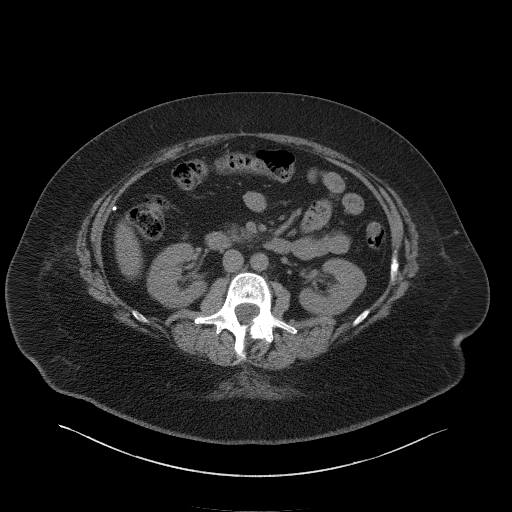
[im 60/95  bone]
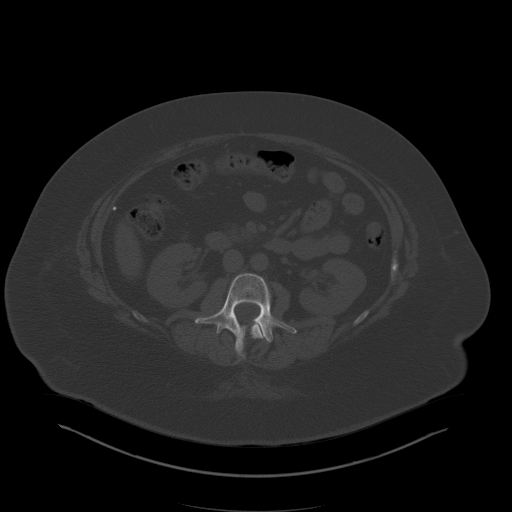
[im 69/95  soft-tissue]
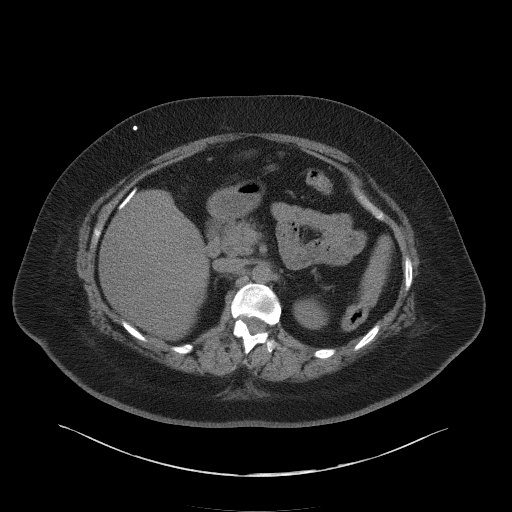
[im 73/95  soft-tissue]
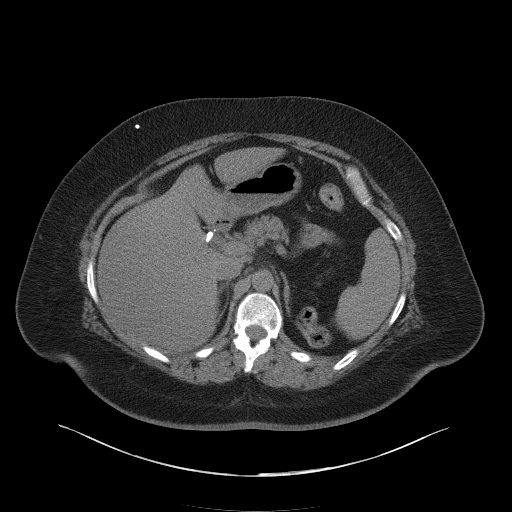
[im 82/95  soft-tissue]
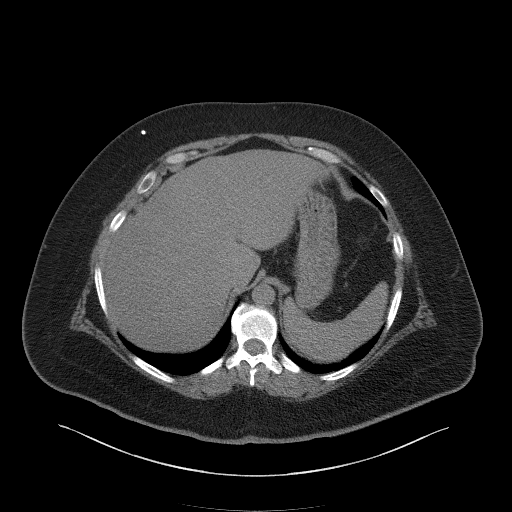
[im 90/95  soft-tissue]
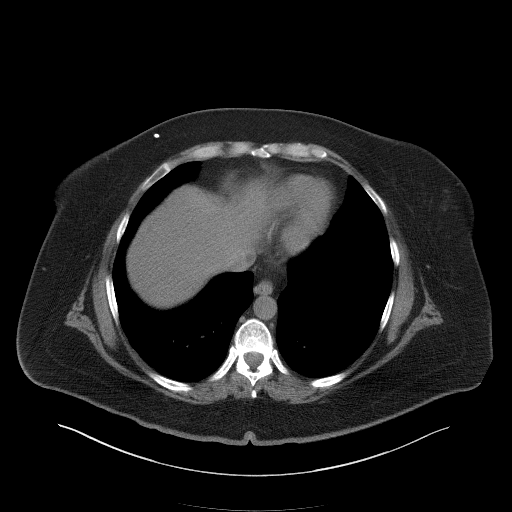

[Series 5: renal stone 3.0 coronal · coronal · 0.93mm/px · 3 of 110 slices shown]
[im 37/110  soft-tissue]
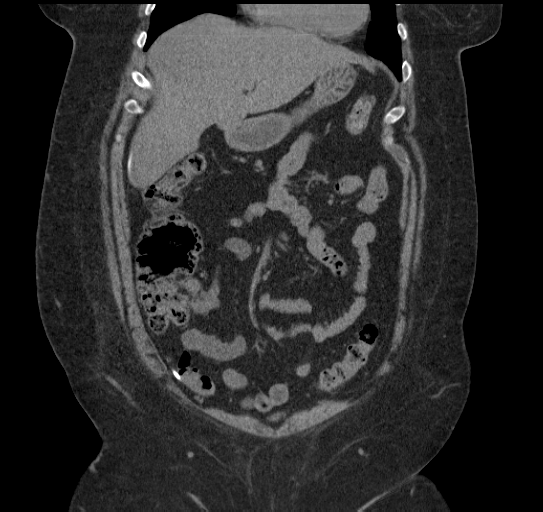
[im 49/110  soft-tissue]
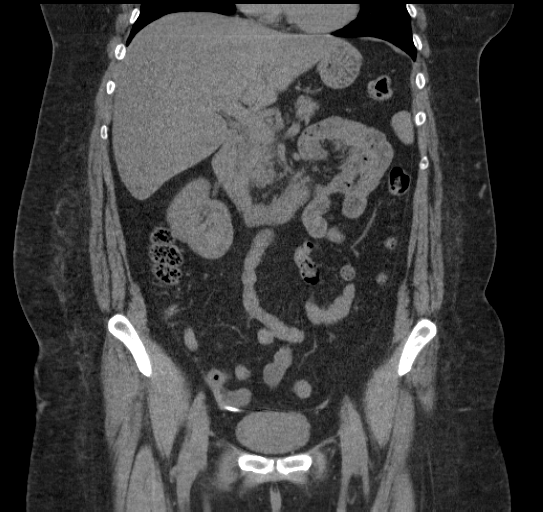
[im 61/110  soft-tissue]
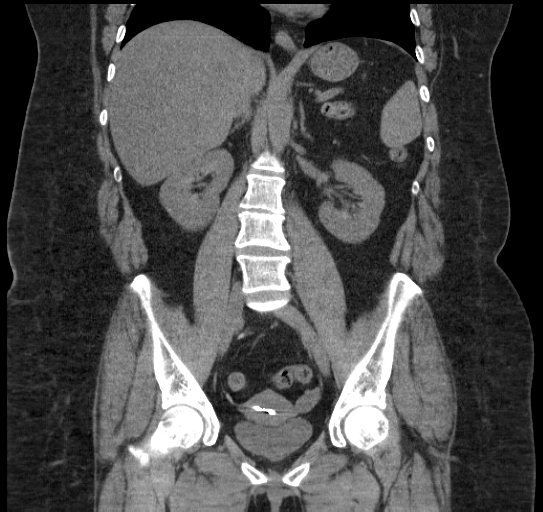

[16 of 46 positions shown; findings below may reference images not displayed]

FINDINGS: Minimal right basilar atelectasis is noted.

The liver and spleen are unremarkable in appearance. The patient is
status post cholecystectomy, with clips noted at the gallbladder
fossa. The pancreas and adrenal glands are unremarkable.

The kidneys are unremarkable in appearance. There is no evidence of
hydronephrosis. No renal or ureteral stones are seen. No perinephric
stranding is appreciated.

No free fluid is identified. The small bowel is unremarkable in
appearance. The stomach is within normal limits. No acute vascular
abnormalities are seen.

The appendix is normal in caliber, without evidence of appendicitis.
The colon is largely decompressed and is unremarkable in appearance.

The patient's ventriculoperitoneal shunt is noted ending at the
lower pelvis.

The bladder is mildly distended and grossly unremarkable. The
intrauterine device is somewhat distal in position, along the lower
uterine segment, with the prongs noted extending into the
myometrium. The uterus is otherwise grossly unremarkable. The
ovaries are relatively symmetric. No suspicious adnexal masses are
seen. No inguinal lymphadenopathy is seen.

No acute osseous abnormalities are identified. There appears to be a
chronic right-sided pars defect at L3, without evidence of
anterolisthesis.
IMPRESSION: 1. No acute abnormality seen to explain the patient's symptoms.
2. Intrauterine device is somewhat distal in position, seen along
the lower uterine segment, with the prongs noted extending into the
myometrium. OB/GYN consultation is suggested for repositioning.
3. Visualized portions of the ventriculoperitoneal shunt are
unremarkable.
4. Chronic right-sided pars defect at L3, without evidence of
anterolisthesis.

## 2015-05-26 ENCOUNTER — Ambulatory Visit
Admission: RE | Admit: 2015-05-26 | Discharge: 2015-05-26 | Disposition: A | Discharge status: Home / Self Care | Payer: 59 | Source: Ambulatory Visit

## 2015-05-26 DIAGNOSIS — Z1231 Encounter for screening mammogram for malignant neoplasm of breast: Secondary | ICD-10-CM

## 2015-10-30 ENCOUNTER — Emergency Department (HOSPITAL_BASED_OUTPATIENT_CLINIC_OR_DEPARTMENT_OTHER): Payer: 59

## 2015-10-30 ENCOUNTER — Emergency Department (HOSPITAL_BASED_OUTPATIENT_CLINIC_OR_DEPARTMENT_OTHER)
Admission: EM | Admit: 2015-10-30 | Discharge: 2015-10-30 | Disposition: A | Discharge status: Home / Self Care | Payer: 59 | Attending: Emergency Medicine | Admitting: Emergency Medicine

## 2015-10-30 ENCOUNTER — Encounter (HOSPITAL_BASED_OUTPATIENT_CLINIC_OR_DEPARTMENT_OTHER): Payer: Self-pay | Admitting: *Deleted

## 2015-10-30 DIAGNOSIS — E119 Type 2 diabetes mellitus without complications: Secondary | ICD-10-CM | POA: Insufficient documentation

## 2015-10-30 DIAGNOSIS — M7121 Synovial cyst of popliteal space [Baker], right knee: Secondary | ICD-10-CM | POA: Diagnosis not present

## 2015-10-30 DIAGNOSIS — M25561 Pain in right knee: Secondary | ICD-10-CM | POA: Diagnosis present

## 2015-10-30 DIAGNOSIS — F17210 Nicotine dependence, cigarettes, uncomplicated: Secondary | ICD-10-CM | POA: Diagnosis not present

## 2015-10-30 HISTORY — DX: Acute embolism and thrombosis of unspecified deep veins of unspecified lower extremity: I82.409

## 2015-10-30 LAB — CBC WITH DIFFERENTIAL/PLATELET
BASOS PCT: 0 %
Basophils Absolute: 0 10*3/uL (ref 0.0–0.1)
EOS ABS: 0.1 10*3/uL (ref 0.0–0.7)
Eosinophils Relative: 1 %
HCT: 44.2 % (ref 36.0–46.0)
HEMOGLOBIN: 14.5 g/dL (ref 12.0–15.0)
Lymphocytes Relative: 35 %
Lymphs Abs: 3.3 10*3/uL (ref 0.7–4.0)
MCH: 29.4 pg (ref 26.0–34.0)
MCHC: 32.8 g/dL (ref 30.0–36.0)
MCV: 89.5 fL (ref 78.0–100.0)
MONOS PCT: 7 %
Monocytes Absolute: 0.6 10*3/uL (ref 0.1–1.0)
Neutro Abs: 5.4 10*3/uL (ref 1.7–7.7)
Neutrophils Relative %: 57 %
Platelets: 266 10*3/uL (ref 150–400)
RBC: 4.94 MIL/uL (ref 3.87–5.11)
RDW: 14.6 % (ref 11.5–15.5)
WBC: 9.5 10*3/uL (ref 4.0–10.5)

## 2015-10-30 LAB — BASIC METABOLIC PANEL
Anion gap: 9 (ref 5–15)
BUN: 15 mg/dL (ref 6–20)
CHLORIDE: 106 mmol/L (ref 101–111)
CO2: 22 mmol/L (ref 22–32)
Calcium: 9.1 mg/dL (ref 8.9–10.3)
Creatinine, Ser: 0.66 mg/dL (ref 0.44–1.00)
GFR calc non Af Amer: >60 mL/min (ref >60)
Glucose, Bld: 166 mg/dL — ABNORMAL HIGH (ref 65–99)
Potassium: 3.5 mmol/L (ref 3.5–5.1)
Sodium: 137 mmol/L (ref 135–145)

## 2015-10-30 MED ORDER — IBUPROFEN 800 MG PO TABS: 800.0000 mg | ORAL_TABLET | Freq: Three times a day (TID) | ORAL | 0 refills | Status: AC

## 2015-10-30 MED ORDER — IBUPROFEN 400 MG PO TABS
600.0000 mg | ORAL_TABLET | Freq: Once | ORAL | Status: AC
  Administered 2015-10-30: 600 mg via ORAL
  Filled 2015-10-30: qty 1

## 2015-10-30 NOTE — ED Triage Notes (Signed)
1 week of right knee pain, denies injury.

## 2015-10-30 NOTE — ED Provider Notes (Signed)
[facility] Provider Note   CSN: [id number] Arrival date & time: 10/30/15  1632  First Provider Contact:  First MD Initiated Contact with Patient 10/30/15 1848     By signing my name below, I, Vista Mink, attest that this documentation has been prepared under the direction and in the presence of [facility].  Electronically Signed: Vista Mink, ED Scribe. 10/30/15. 7:09 PM.   History   Chief Complaint Chief Complaint  Patient presents with  . Knee Pain    HPI HPI Comments: Allison Vasquez is a 45 y.o. Female, with a PMHx of DVT, DM, fibromyalgia who presents to the Emergency Department complaining of gradually worsening pain right knee pain that radiates distally toward her ankle; onset one week ago. Pt reports that she has chronic bilateral leg swelling but states that her right leg has become much more swollen than normal. Pt is able to move the extremity but with increased difficulty due to the pain. Pt also reports difficulty ambulating due to the pain. Pt has not taken any OTC medication for pain. Pt also reports Hx of DVT approximately 5 years ago; was taking blood thinners for a period of time s/p but does not take them currently. Pt denies any recent injury to the extremity. Pt denies fever, chills, chest pain, SOB, abdominal pain, nausea, vomiting, diarrhea.      The history is provided by the patient. No language interpreter was used.    Past Medical History:  Diagnosis Date  . Back pain   . Diabetes mellitus without complication (HCC)   . DVT (deep venous thrombosis) [facility])     Patient Active Problem List   Diagnosis Date Noted  . LBP (low back pain) 09/12/2013    Past Surgical History:  Procedure Laterality Date  . CHOLECYSTECTOMY    . VENTRICULOPERITONEAL SHUNT      OB History    No data available       Home Medications    Prior to Admission medications   Medication Sig Start Date End Date Taking? Authorizing Provider    Canagliflozin-Metformin HCl 150-500 MG TABS Take by mouth.    Historical Provider, MD  glipiZIDE (GLUCOTROL XL) 2.5 MG 24 hr tablet Take 2.5 mg by mouth daily with breakfast.    Historical Provider, MD  ibuprofen (ADVIL,MOTRIN) 800 MG tablet Take 1 tablet (800 mg total) by mouth 3 (three) times daily. 10/30/15   Cheri Fowler, PA-C  omeprazole-sodium bicarbonate (ZEGERID) 40-1100 MG per capsule Take 1 capsule by mouth daily before breakfast.    Historical Provider, MD  oxymorphone (OPANA) 10 MG tablet Take 10 mg by mouth every 4 (four) hours as needed for pain.    Historical Provider, MD  simvastatin (ZOCOR) 20 MG tablet Take 20 mg by mouth daily.    Historical Provider, MD    Family History No family history on file.  Social History Social History  Substance Use Topics  . Smoking status: Current Every Day Smoker    Packs/day: 1.00    Types: Cigarettes  . Smokeless tobacco: Never Used  . Alcohol use No     Allergies   Vioxx [rofecoxib]; Metformin and related; Gabapentin; and Lyrica [pregabalin]   Review of Systems Review of Systems  Constitutional: Negative for chills and fever.  Respiratory: Negative for shortness of breath.   Cardiovascular: Positive for leg swelling (right). Negative for chest pain.  Gastrointestinal: Negative for abdominal pain, diarrhea, nausea and vomiting.  Musculoskeletal: Positive for arthralgias (right lower  extremity).     Physical Exam Updated Vital Signs BP 140/86 (BP Location: Left Arm)   Pulse 95   Temp 97.8 F (36.6 C) (Oral)   Resp 18   Ht  (1.702 m)   Wt 124.7 kg   LMP 10/26/2015   SpO2 97%   BMI 43.07 kg/m   Physical Exam  Constitutional: She is oriented to person, place, and time. She appears well-developed and well-nourished.  HENT:  Head: Normocephalic and atraumatic.  Right Ear: External ear normal.  Left Ear: External ear normal.  Eyes: Conjunctivae are normal. No scleral icterus.  Neck: No tracheal deviation present.   Pulmonary/Chest: Effort normal. No respiratory distress.  Abdominal: She exhibits no distension.  Musculoskeletal: Normal range of motion.  Neurological: She is alert and oriented to person, place, and time.  Skin: Skin is warm and dry.  Psychiatric: She has a normal mood and affect. Her behavior is normal.     ED Treatments / Results  Labs (all labs ordered are listed, but only abnormal results are displayed) Labs Reviewed  BASIC METABOLIC PANEL - Abnormal; Notable for the following:       Result Value   Glucose, Bld 166 (*)    All other components within normal limits  CBC WITH DIFFERENTIAL/PLATELET    EKG  EKG Interpretation None       Radiology US Venous Img Lower Unilateral Right  Result Date: 10/30/2015 CLINICAL DATA:  Patient with right knee pain for 3 days. EXAM: RIGHT LOWER EXTREMITY VENOUS DOPPLER ULTRASOUND TECHNIQUE: Gray-scale sonography with graded compression, as well as color Doppler and duplex ultrasound were performed to evaluate the lower extremity deep venous systems from the level of the common femoral vein and including the common femoral, femoral, profunda femoral, popliteal and calf veins including the posterior tibial, peroneal and gastrocnemius veins when visible. The superficial great saphenous vein was also interrogated. Spectral Doppler was utilized to evaluate flow at rest and with distal augmentation maneuvers in the common femoral, femoral and popliteal veins. COMPARISON:  None. FINDINGS: Contralateral Common Femoral Vein: Respiratory phasicity is normal and symmetric with the symptomatic side. No evidence of thrombus. Normal compressibility. Common Femoral Vein: No evidence of thrombus. Normal compressibility, respiratory phasicity and response to augmentation. Saphenofemoral Junction: No evidence of thrombus. Normal compressibility and flow on color Doppler imaging. Profunda Femoral Vein: No evidence of thrombus. Normal compressibility and flow on  color Doppler imaging. Femoral Vein: No evidence of thrombus. Normal compressibility, respiratory phasicity and response to augmentation. Popliteal Vein: No evidence of thrombus. Normal compressibility, respiratory phasicity and response to augmentation. Calf Veins: No evidence of thrombus. Normal compressibility and flow on color Doppler imaging. Superficial Great Saphenous Vein: No evidence of thrombus. Normal compressibility and flow on color Doppler imaging. Venous Reflux:  None. Other Findings: There is a 3.4 x 1.2 x 2.0 cm fluid collection within the popliteal fossa. IMPRESSION: No evidence of deep venous thrombosis. Probable Baker's cyst. Electronically Signed   By: Annia Belt M.D.   On: 10/30/2015 20:12   Dg Knee Complete 4 Views Right  Result Date: 10/30/2015 CLINICAL DATA:  Right anterior knee pain above patella. EXAM: RIGHT KNEE - COMPLETE 4+ VIEW COMPARISON:  None. FINDINGS: Osseous alignment is normal. Probable old healed fracture of the proximal right fibula. No acute-appearing fracture. No acute or suspicious osseous lesion. No appreciable joint effusion and adjacent soft tissues are unremarkable. IMPRESSION: No acute findings. Probable old healed fracture of the proximal fibula. Electronically Signed  By: Bary Richard M.D.   On: 10/30/2015 17:41    Procedures Procedures (including critical care time)  Medications Ordered in ED Medications  ibuprofen (ADVIL,MOTRIN) tablet 600 mg (600 mg Oral Given 10/30/15 1912)     Initial Impression / Assessment and Plan / ED Course  I have reviewed the triage vital signs and the nursing notes.  Pertinent labs & imaging results that were available during my care of the patient were reviewed by me and considered in my medical decision making (see chart for details).  Clinical Course   Patient presents with right leg pain and swelling for the past week. No chest pain or shortness of breath. No color changes. VSS, NAD. She has good pulses. She is  neurovascularly intact. Plain films negative. Given swelling of the lower extremity and ultrasound was obtained. This was negative for DVT. It did show a Baker's cyst, which could be the etiology of her symptoms. We'll discharge home with ibuprofen. Follow up with orthopedics. Return precautions discussed. Patient agrees and acknowledges the above plan for discharge.   Final Clinical Impressions(s) / ED Diagnoses   Final diagnoses:  Baker cyst, right  Knee pain, right    New Prescriptions New Prescriptions   IBUPROFEN (ADVIL,MOTRIN) 800 MG TABLET    Take 1 tablet (800 mg total) by mouth 3 (three) times daily.   I personally performed the services described in this documentation, which was scribed in my presence. The recorded information has been reviewed and is accurate.     Cheri Fowler, PA-C 10/30/15 2105    Loren Racer, MD 11/02/15 2241

## 2016-06-02 ENCOUNTER — Other Ambulatory Visit: Payer: Self-pay | Admitting: Obstetrics and Gynecology

## 2016-06-02 DIAGNOSIS — Z1231 Encounter for screening mammogram for malignant neoplasm of breast: Secondary | ICD-10-CM

## 2016-06-13 ENCOUNTER — Other Ambulatory Visit (HOSPITAL_COMMUNITY)
Admission: RE | Admit: 2016-06-13 | Discharge: 2016-06-13 | Disposition: A | Discharge status: Home / Self Care | Payer: 59 | Source: Ambulatory Visit | Attending: Obstetrics and Gynecology | Admitting: Obstetrics and Gynecology

## 2016-06-13 ENCOUNTER — Other Ambulatory Visit: Payer: Self-pay | Admitting: Obstetrics and Gynecology

## 2016-06-13 DIAGNOSIS — Z1151 Encounter for screening for human papillomavirus (HPV): Secondary | ICD-10-CM | POA: Insufficient documentation

## 2016-06-13 DIAGNOSIS — Z01419 Encounter for gynecological examination (general) (routine) without abnormal findings: Secondary | ICD-10-CM | POA: Diagnosis present

## 2016-06-15 LAB — CYTOLOGY - PAP
Diagnosis: NEGATIVE
HPV (WINDOPATH): NOT DETECTED

## 2016-06-30 ENCOUNTER — Ambulatory Visit
Admission: RE | Admit: 2016-06-30 | Discharge: 2016-06-30 | Disposition: A | Discharge status: Home / Self Care | Payer: 59 | Source: Ambulatory Visit | Attending: Obstetrics and Gynecology | Admitting: Obstetrics and Gynecology

## 2016-06-30 DIAGNOSIS — Z1231 Encounter for screening mammogram for malignant neoplasm of breast: Secondary | ICD-10-CM

## 2017-08-06 ENCOUNTER — Other Ambulatory Visit: Payer: Self-pay | Admitting: Obstetrics and Gynecology

## 2017-08-06 ENCOUNTER — Ambulatory Visit
Admission: RE | Admit: 2017-08-06 | Discharge: 2017-08-06 | Disposition: A | Discharge status: Home / Self Care | Payer: Medicare Other | Source: Ambulatory Visit | Attending: Obstetrics and Gynecology | Admitting: Obstetrics and Gynecology

## 2017-08-06 DIAGNOSIS — Z1231 Encounter for screening mammogram for malignant neoplasm of breast: Secondary | ICD-10-CM

## 2018-12-09 ENCOUNTER — Ambulatory Visit
Admission: RE | Admit: 2018-12-09 | Discharge: 2018-12-09 | Disposition: A | Discharge status: Home / Self Care | Payer: 59 | Source: Ambulatory Visit | Attending: Specialist | Admitting: Specialist

## 2018-12-09 ENCOUNTER — Other Ambulatory Visit: Payer: Self-pay | Admitting: Specialist

## 2019-03-10 ENCOUNTER — Other Ambulatory Visit: Payer: Self-pay

## 2019-03-10 DIAGNOSIS — Z20822 Contact with and (suspected) exposure to covid-19: Secondary | ICD-10-CM

## 2019-03-11 LAB — NOVEL CORONAVIRUS, NAA: SARS-CoV-2, NAA: NOT DETECTED

## 2020-01-16 ENCOUNTER — Other Ambulatory Visit: Payer: Self-pay | Admitting: Obstetrics and Gynecology

## 2020-01-16 DIAGNOSIS — Z1231 Encounter for screening mammogram for malignant neoplasm of breast: Secondary | ICD-10-CM

## 2020-01-19 ENCOUNTER — Other Ambulatory Visit: Payer: Self-pay

## 2020-01-19 ENCOUNTER — Ambulatory Visit
Admission: RE | Admit: 2020-01-19 | Discharge: 2020-01-19 | Disposition: A | Discharge status: Home / Self Care | Payer: 59 | Source: Ambulatory Visit | Attending: Obstetrics and Gynecology | Admitting: Obstetrics and Gynecology

## 2020-01-19 DIAGNOSIS — Z1231 Encounter for screening mammogram for malignant neoplasm of breast: Secondary | ICD-10-CM

## 2021-03-02 ENCOUNTER — Other Ambulatory Visit: Payer: Self-pay | Admitting: Obstetrics and Gynecology

## 2021-03-02 DIAGNOSIS — Z1231 Encounter for screening mammogram for malignant neoplasm of breast: Secondary | ICD-10-CM

## 2021-03-14 ENCOUNTER — Ambulatory Visit
Admission: RE | Admit: 2021-03-14 | Discharge: 2021-03-14 | Disposition: A | Discharge status: Home / Self Care | Payer: 59 | Source: Ambulatory Visit | Attending: Obstetrics and Gynecology | Admitting: Obstetrics and Gynecology

## 2021-03-14 DIAGNOSIS — Z1231 Encounter for screening mammogram for malignant neoplasm of breast: Secondary | ICD-10-CM

## 2022-02-28 ENCOUNTER — Other Ambulatory Visit: Payer: Self-pay | Admitting: Obstetrics and Gynecology

## 2022-02-28 DIAGNOSIS — Z1231 Encounter for screening mammogram for malignant neoplasm of breast: Secondary | ICD-10-CM

## 2022-03-28 ENCOUNTER — Ambulatory Visit: Payer: 59

## 2022-05-12 ENCOUNTER — Ambulatory Visit
Admission: RE | Admit: 2022-05-12 | Discharge: 2022-05-12 | Disposition: A | Discharge status: Home / Self Care | Payer: 59 | Source: Ambulatory Visit | Attending: Obstetrics and Gynecology | Admitting: Obstetrics and Gynecology

## 2022-05-12 ENCOUNTER — Ambulatory Visit: Payer: 59

## 2022-05-12 DIAGNOSIS — Z1231 Encounter for screening mammogram for malignant neoplasm of breast: Secondary | ICD-10-CM

## 2023-06-08 ENCOUNTER — Encounter (HOSPITAL_COMMUNITY): Payer: Self-pay

## 2023-06-29 ENCOUNTER — Other Ambulatory Visit: Payer: Self-pay | Admitting: Obstetrics and Gynecology

## 2023-06-29 DIAGNOSIS — Z1231 Encounter for screening mammogram for malignant neoplasm of breast: Secondary | ICD-10-CM

## 2023-08-03 ENCOUNTER — Encounter (HOSPITAL_COMMUNITY): Payer: Self-pay

## 2023-08-10 ENCOUNTER — Ambulatory Visit
Admission: RE | Admit: 2023-08-10 | Discharge: 2023-08-10 | Disposition: A | Discharge status: Home / Self Care | Source: Ambulatory Visit | Attending: Obstetrics and Gynecology | Admitting: Obstetrics and Gynecology

## 2023-08-10 DIAGNOSIS — Z1231 Encounter for screening mammogram for malignant neoplasm of breast: Secondary | ICD-10-CM
